# Patient Record
Sex: Female | Born: 1975 | Race: White | Hispanic: No | Smoking: Former smoker
Health system: Southern US, Community
[De-identification: ages and names within clinical notes are randomized; demographics above are authoritative.]

## PROBLEM LIST (undated history)

## (undated) HISTORY — PX: CARPAL TUNNEL RELEASE: SHX101

## (undated) HISTORY — PX: TUBAL LIGATION: SHX77

---

## 2017-01-30 DIAGNOSIS — Z6841 Body Mass Index (BMI) 40.0 and over, adult: Secondary | ICD-10-CM | POA: Diagnosis not present

## 2017-01-30 DIAGNOSIS — F329 Major depressive disorder, single episode, unspecified: Secondary | ICD-10-CM | POA: Diagnosis not present

## 2017-01-30 DIAGNOSIS — E559 Vitamin D deficiency, unspecified: Secondary | ICD-10-CM | POA: Diagnosis not present

## 2017-01-30 DIAGNOSIS — E785 Hyperlipidemia, unspecified: Secondary | ICD-10-CM | POA: Diagnosis not present

## 2017-01-30 DIAGNOSIS — I10 Essential (primary) hypertension: Secondary | ICD-10-CM | POA: Diagnosis not present

## 2017-01-30 DIAGNOSIS — D509 Iron deficiency anemia, unspecified: Secondary | ICD-10-CM | POA: Diagnosis not present

## 2017-02-12 MED FILL — BUPROPION HCL XL 150 MG TAB: 150 | 90 days supply | Qty: 90 | Fill #0

## 2017-02-12 MED FILL — OMEPRAZOLE DR 20 MG CAPSULE: 20 | 90 days supply | Qty: 90 | Fill #0

## 2017-02-12 MED FILL — VALACYCLOVIR HCL 500 MG TAB: 500 | 90 days supply | Qty: 180 | Fill #0

## 2017-02-12 MED FILL — LISINOPRIL 10 MG TABLET: 10 | 90 days supply | Qty: 90 | Fill #0

## 2017-03-06 DIAGNOSIS — D509 Iron deficiency anemia, unspecified: Secondary | ICD-10-CM | POA: Diagnosis not present

## 2017-03-06 DIAGNOSIS — K625 Hemorrhage of anus and rectum: Secondary | ICD-10-CM | POA: Diagnosis not present

## 2017-04-03 MED FILL — GAVILYTE-N SOLUTION: 420 | 10 days supply | Qty: 4000 | Fill #0

## 2017-04-10 DIAGNOSIS — K5730 Diverticulosis of large intestine without perforation or abscess without bleeding: Secondary | ICD-10-CM | POA: Diagnosis not present

## 2017-04-10 DIAGNOSIS — K921 Melena: Secondary | ICD-10-CM | POA: Diagnosis not present

## 2017-04-10 DIAGNOSIS — D120 Benign neoplasm of cecum: Secondary | ICD-10-CM | POA: Diagnosis not present

## 2017-04-10 DIAGNOSIS — K635 Polyp of colon: Secondary | ICD-10-CM | POA: Diagnosis not present

## 2017-04-10 DIAGNOSIS — D509 Iron deficiency anemia, unspecified: Secondary | ICD-10-CM | POA: Diagnosis not present

## 2017-04-10 DIAGNOSIS — K625 Hemorrhage of anus and rectum: Secondary | ICD-10-CM | POA: Diagnosis not present

## 2017-05-22 DIAGNOSIS — Z6841 Body Mass Index (BMI) 40.0 and over, adult: Secondary | ICD-10-CM | POA: Diagnosis not present

## 2017-05-22 DIAGNOSIS — Z0000 Encounter for general adult medical examination without abnormal findings: Secondary | ICD-10-CM | POA: Diagnosis not present

## 2017-05-22 DIAGNOSIS — B009 Herpesviral infection, unspecified: Secondary | ICD-10-CM | POA: Diagnosis not present

## 2017-05-22 DIAGNOSIS — E785 Hyperlipidemia, unspecified: Secondary | ICD-10-CM | POA: Diagnosis not present

## 2017-05-22 DIAGNOSIS — D509 Iron deficiency anemia, unspecified: Secondary | ICD-10-CM | POA: Diagnosis not present

## 2017-05-22 DIAGNOSIS — M2550 Pain in unspecified joint: Secondary | ICD-10-CM | POA: Diagnosis not present

## 2017-05-22 DIAGNOSIS — I10 Essential (primary) hypertension: Secondary | ICD-10-CM | POA: Diagnosis not present

## 2017-05-22 DIAGNOSIS — F329 Major depressive disorder, single episode, unspecified: Secondary | ICD-10-CM | POA: Diagnosis not present

## 2017-05-28 MED FILL — OMEPRAZOLE 20 MG CAP: 20 | 90 days supply | Qty: 90 | Fill #1

## 2017-05-28 MED FILL — DICLOFENAC SOD 75 MG TAB EC: 75 | 90 days supply | Qty: 180 | Fill #0

## 2017-07-15 MED FILL — LISINOPRIL 10 MG TABS: 10 | 90 days supply | Qty: 90 | Fill #1

## 2017-07-22 DIAGNOSIS — F329 Major depressive disorder, single episode, unspecified: Secondary | ICD-10-CM | POA: Diagnosis not present

## 2017-08-13 DIAGNOSIS — Z1231 Encounter for screening mammogram for malignant neoplasm of breast: Secondary | ICD-10-CM | POA: Diagnosis not present

## 2017-08-13 MED FILL — SERTRALINE HCL 50 MG TABLET: 50 | 30 days supply | Qty: 30 | Fill #0

## 2017-08-14 MED FILL — VALACYCLOVIR HCL 500 MG TAB: 500 | 90 days supply | Qty: 180 | Fill #1

## 2017-09-10 MED FILL — OMEPRAZOLE 20 MG CAP: 20 | 90 days supply | Qty: 90 | Fill #0

## 2017-09-11 MED FILL — SERTRALINE HCL 50 MG TABLET: 50 | 30 days supply | Qty: 30 | Fill #1

## 2017-09-29 MED FILL — DICLOFENAC SODIUM 75 MG TAB: 75 | 90 days supply | Qty: 180 | Fill #1

## 2017-09-30 DIAGNOSIS — H52223 Regular astigmatism, bilateral: Secondary | ICD-10-CM | POA: Diagnosis not present

## 2017-09-30 DIAGNOSIS — J069 Acute upper respiratory infection, unspecified: Secondary | ICD-10-CM | POA: Diagnosis not present

## 2017-09-30 DIAGNOSIS — H40013 Open angle with borderline findings, low risk, bilateral: Secondary | ICD-10-CM | POA: Diagnosis not present

## 2017-09-30 DIAGNOSIS — F329 Major depressive disorder, single episode, unspecified: Secondary | ICD-10-CM | POA: Diagnosis not present

## 2017-09-30 DIAGNOSIS — H5203 Hypermetropia, bilateral: Secondary | ICD-10-CM | POA: Diagnosis not present

## 2017-10-08 MED FILL — LISINOPRIL 10 MG TABS: 10 | 90 days supply | Qty: 90 | Fill #0

## 2017-10-09 MED FILL — SERTRALINE HCL 50 MG TABLET: 50 | 90 days supply | Qty: 90 | Fill #0

## 2017-12-08 MED FILL — OMEPRAZOLE 20 MG CAP: 20 | 90 days supply | Qty: 90 | Fill #0

## 2017-12-08 MED FILL — VALACYCLOVIR HCL 500 MG TAB: 500 | 90 days supply | Qty: 180 | Fill #0

## 2017-12-22 DIAGNOSIS — M2550 Pain in unspecified joint: Secondary | ICD-10-CM | POA: Diagnosis not present

## 2017-12-22 DIAGNOSIS — F329 Major depressive disorder, single episode, unspecified: Secondary | ICD-10-CM | POA: Diagnosis not present

## 2017-12-22 DIAGNOSIS — E559 Vitamin D deficiency, unspecified: Secondary | ICD-10-CM | POA: Diagnosis not present

## 2017-12-22 DIAGNOSIS — E785 Hyperlipidemia, unspecified: Secondary | ICD-10-CM | POA: Diagnosis not present

## 2017-12-22 DIAGNOSIS — B009 Herpesviral infection, unspecified: Secondary | ICD-10-CM | POA: Diagnosis not present

## 2017-12-22 DIAGNOSIS — Z6841 Body Mass Index (BMI) 40.0 and over, adult: Secondary | ICD-10-CM | POA: Diagnosis not present

## 2017-12-22 DIAGNOSIS — D509 Iron deficiency anemia, unspecified: Secondary | ICD-10-CM | POA: Diagnosis not present

## 2017-12-22 DIAGNOSIS — I10 Essential (primary) hypertension: Secondary | ICD-10-CM | POA: Diagnosis not present

## 2018-01-07 MED FILL — SERTRALINE HCL 50 MG TABLET: 50 | 90 days supply | Qty: 90 | Fill #0

## 2018-01-07 MED FILL — LISINOPRIL 10 MG TABLET: 10 | 90 days supply | Qty: 90 | Fill #1

## 2018-01-21 DIAGNOSIS — D500 Iron deficiency anemia secondary to blood loss (chronic): Secondary | ICD-10-CM | POA: Diagnosis not present

## 2018-01-21 DIAGNOSIS — Z01411 Encounter for gynecological examination (general) (routine) with abnormal findings: Secondary | ICD-10-CM | POA: Diagnosis not present

## 2018-01-21 DIAGNOSIS — N939 Abnormal uterine and vaginal bleeding, unspecified: Secondary | ICD-10-CM | POA: Diagnosis not present

## 2018-01-21 DIAGNOSIS — Z6841 Body Mass Index (BMI) 40.0 and over, adult: Secondary | ICD-10-CM | POA: Diagnosis not present

## 2018-01-28 MED FILL — DICLOFENAC SODIUM 75 MG TAB: 75 | 90 days supply | Qty: 180 | Fill #2

## 2018-02-04 DIAGNOSIS — G5601 Carpal tunnel syndrome, right upper limb: Secondary | ICD-10-CM | POA: Diagnosis not present

## 2018-02-04 DIAGNOSIS — M79641 Pain in right hand: Secondary | ICD-10-CM | POA: Diagnosis not present

## 2018-02-04 DIAGNOSIS — M79642 Pain in left hand: Secondary | ICD-10-CM | POA: Diagnosis not present

## 2018-02-17 DIAGNOSIS — N939 Abnormal uterine and vaginal bleeding, unspecified: Secondary | ICD-10-CM | POA: Diagnosis not present

## 2018-03-03 DIAGNOSIS — Z87891 Personal history of nicotine dependence: Secondary | ICD-10-CM | POA: Diagnosis not present

## 2018-03-03 DIAGNOSIS — G5601 Carpal tunnel syndrome, right upper limb: Secondary | ICD-10-CM | POA: Diagnosis not present

## 2018-03-03 DIAGNOSIS — I10 Essential (primary) hypertension: Secondary | ICD-10-CM | POA: Diagnosis not present

## 2018-03-03 DIAGNOSIS — Z6841 Body Mass Index (BMI) 40.0 and over, adult: Secondary | ICD-10-CM | POA: Diagnosis not present

## 2018-03-03 DIAGNOSIS — E669 Obesity, unspecified: Secondary | ICD-10-CM | POA: Diagnosis not present

## 2018-03-03 DIAGNOSIS — K219 Gastro-esophageal reflux disease without esophagitis: Secondary | ICD-10-CM | POA: Diagnosis not present

## 2018-03-03 DIAGNOSIS — F329 Major depressive disorder, single episode, unspecified: Secondary | ICD-10-CM | POA: Diagnosis not present

## 2018-03-09 MED FILL — OMEPRAZOLE 20 MG CAP: 20 | 90 days supply | Qty: 90 | Fill #1

## 2018-03-23 DIAGNOSIS — N939 Abnormal uterine and vaginal bleeding, unspecified: Secondary | ICD-10-CM | POA: Diagnosis not present

## 2018-03-23 DIAGNOSIS — N8501 Benign endometrial hyperplasia: Secondary | ICD-10-CM | POA: Diagnosis not present

## 2018-04-02 DIAGNOSIS — H40013 Open angle with borderline findings, low risk, bilateral: Secondary | ICD-10-CM | POA: Diagnosis not present

## 2018-04-02 DIAGNOSIS — H50811 Duane's syndrome, right eye: Secondary | ICD-10-CM | POA: Diagnosis not present

## 2018-04-09 MED FILL — SERTRALINE HCL 50 MG TABLET: 50 | 90 days supply | Qty: 90 | Fill #1

## 2018-04-09 MED FILL — VALACYCLOVIR HCL 500 MG TAB: 500 | 90 days supply | Qty: 180 | Fill #1

## 2018-04-09 MED FILL — LISINOPRIL 10 MG TABLET: 10 | 90 days supply | Qty: 90 | Fill #0

## 2018-04-22 MED FILL — SETLAKIN 0.15 MG-0.03 MG TA: 0.15-0.03 | 91 days supply | Qty: 91 | Fill #0

## 2018-05-27 DIAGNOSIS — N939 Abnormal uterine and vaginal bleeding, unspecified: Secondary | ICD-10-CM | POA: Diagnosis not present

## 2018-05-27 DIAGNOSIS — Z6841 Body Mass Index (BMI) 40.0 and over, adult: Secondary | ICD-10-CM | POA: Diagnosis not present

## 2018-06-05 MED FILL — OMEPRAZOLE 20 MG CPDR: 20 | 90 days supply | Qty: 90 | Fill #0

## 2018-06-10 ENCOUNTER — Ambulatory Visit (HOSPITAL_COMMUNITY): Admit: 2018-06-10 | Payer: Self-pay | Admitting: Obstetrics & Gynecology

## 2018-06-10 ENCOUNTER — Encounter (HOSPITAL_COMMUNITY): Payer: Self-pay

## 2018-06-10 SURGERY — DILATATION & CURETTAGE/HYSTEROSCOPY WITH HYDROTHERMAL ABLATION
Anesthesia: Choice

## 2018-06-22 DIAGNOSIS — E785 Hyperlipidemia, unspecified: Secondary | ICD-10-CM | POA: Diagnosis not present

## 2018-06-22 DIAGNOSIS — E669 Obesity, unspecified: Secondary | ICD-10-CM | POA: Diagnosis not present

## 2018-06-22 DIAGNOSIS — Z0000 Encounter for general adult medical examination without abnormal findings: Secondary | ICD-10-CM | POA: Diagnosis not present

## 2018-06-22 DIAGNOSIS — D509 Iron deficiency anemia, unspecified: Secondary | ICD-10-CM | POA: Diagnosis not present

## 2018-06-22 DIAGNOSIS — I10 Essential (primary) hypertension: Secondary | ICD-10-CM | POA: Diagnosis not present

## 2018-06-22 MED FILL — traZODone HCL 50 MG TABS: 50 | 30 days supply | Qty: 60 | Fill #0

## 2018-06-23 MED FILL — DICLOFENAC SODIUM 75 MG TAB: 75 | 90 days supply | Qty: 180 | Fill #0

## 2018-07-10 MED FILL — SERTRALINE HCL 50 MG TABLET: 50 | 90 days supply | Qty: 90 | Fill #2

## 2018-07-10 MED FILL — LISINOPRIL 10 MG TABLET: 10 | 90 days supply | Qty: 90 | Fill #1

## 2018-07-12 DIAGNOSIS — J019 Acute sinusitis, unspecified: Secondary | ICD-10-CM | POA: Diagnosis not present

## 2018-07-17 MED FILL — SETLAKIN 0.15 MG-0.03 MG TA: 0.15-0.03 | 91 days supply | Qty: 91 | Fill #1

## 2018-09-04 MED FILL — OMEPRAZOLE 20 MG CPDR: 20 | 90 days supply | Qty: 90 | Fill #0

## 2018-10-05 MED FILL — DICLOFENAC SODIUM 75 MG TAB: 75 | 90 days supply | Qty: 180 | Fill #1 | Status: TO

## 2018-10-05 MED FILL — VALACYCLOVIR HCL 500 MG TAB: 500 | 90 days supply | Qty: 180 | Fill #0

## 2018-10-07 DIAGNOSIS — R05 Cough: Secondary | ICD-10-CM | POA: Diagnosis not present

## 2018-10-07 DIAGNOSIS — J40 Bronchitis, not specified as acute or chronic: Secondary | ICD-10-CM | POA: Diagnosis not present

## 2018-10-07 DIAGNOSIS — R509 Fever, unspecified: Secondary | ICD-10-CM | POA: Diagnosis not present

## 2018-10-07 DIAGNOSIS — R231 Pallor: Secondary | ICD-10-CM | POA: Diagnosis not present

## 2018-10-09 MED FILL — SETLAKIN 0.15 MG-0.03 MG TA: 0.15-0.03 | 91 days supply | Qty: 91 | Fill #2 | Status: TO

## 2018-10-09 MED FILL — SERTRALINE HCL 50 MG TABLET: 50 | 90 days supply | Qty: 90 | Fill #0 | Status: TO

## 2018-10-09 MED FILL — LISINOPRIL 10 MG TABS: 10 | 90 days supply | Qty: 90 | Fill #2

## 2018-10-13 DIAGNOSIS — H40013 Open angle with borderline findings, low risk, bilateral: Secondary | ICD-10-CM | POA: Diagnosis not present

## 2018-10-13 DIAGNOSIS — H50811 Duane's syndrome, right eye: Secondary | ICD-10-CM | POA: Diagnosis not present

## 2018-10-13 DIAGNOSIS — H52223 Regular astigmatism, bilateral: Secondary | ICD-10-CM | POA: Diagnosis not present

## 2018-10-13 DIAGNOSIS — H5203 Hypermetropia, bilateral: Secondary | ICD-10-CM | POA: Diagnosis not present

## 2018-10-27 MED FILL — HYDROCORTISONE ACETATE 25 M: 25 | 10 days supply | Qty: 30 | Fill #0

## 2018-11-02 ENCOUNTER — Institutional Professional Consult (permissible substitution): Payer: Self-pay | Admitting: Pulmonary Disease

## 2018-11-04 ENCOUNTER — Encounter: Payer: Self-pay | Admitting: Pulmonary Disease

## 2018-11-04 ENCOUNTER — Other Ambulatory Visit: Payer: Self-pay

## 2018-11-04 ENCOUNTER — Ambulatory Visit: Payer: 59 | Admitting: Pulmonary Disease

## 2018-11-04 ENCOUNTER — Ambulatory Visit (INDEPENDENT_AMBULATORY_CARE_PROVIDER_SITE_OTHER)
Admission: RE | Admit: 2018-11-04 | Discharge: 2018-11-04 | Disposition: A | Discharge status: Home / Self Care | Payer: 59 | Source: Ambulatory Visit | Attending: Pulmonary Disease | Admitting: Pulmonary Disease

## 2018-11-04 VITALS — BP 128/82 | HR 72 | Ht 63.0 in | Wt 254.0 lb

## 2018-11-04 DIAGNOSIS — J849 Interstitial pulmonary disease, unspecified: Secondary | ICD-10-CM

## 2018-11-04 DIAGNOSIS — R9389 Abnormal findings on diagnostic imaging of other specified body structures: Secondary | ICD-10-CM | POA: Diagnosis not present

## 2018-11-04 NOTE — Progress Notes (Signed)
Subjective:    Patient ID: Heather Martin, female    DOB: 09/11/1975, 43 y.o.   MRN: [id number]  HPI  Chief Complaint  Patient presents with  . Pulm Consult    Referred by [facility] for increased SOB with activities. Noticed this after being sick back in November 2762.     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 She reports lower respiratory infection in 06/2018 when she was given antibiotics and since then she has been experiencing dyspnea on unusual exertion such as walking on level ground for a few minutes.  She recovered from this episode and had another episode of bronchitis and 09/2018 where she went to her PCP, flu test was negative he was given Depo-Medrol. Chest x-ray was performed 10/07/2018 which showed" diffuse reticulonodular opacities probably due to chronic lung disease"  She reports persistent dyspnea although her acute symptoms have resolved.  Occasional dry cough.  Heartburn is controlled on Prilosec.  Blood pressures controlled on lisinopril. She denies wheezing .  He reports occasional mild pedal edema.  She reports taking diclofenac for several years due to pain in her knees and ankles and back.  She has been tested for autoimmune arthritis in the past which was negative.  She lives in an older home built in the 78s, there is a history of leakage in her roof and mold.  She uses gas heat.  She lives with 3 dogs sleep in her bed.  She smoked about a pack per day until she quit in 2009, less than 20 pack years.  She was exposed to secondhand smoke from her mom who has severe COPD  Past medical history-depression heartburn, hypertension, carpal tunnel syndrome  Past surgical history tubal ligation, carpal tunnel release  Chest x-ray was obtained today which I personally reviewed which shows prominent interstitium, lateral film does not suggest any infiltrate, agree that interstitial markings do extend to lateral  third of the lung fields  Allergies  Allergen Reactions  . Cashew Nut Oil Anaphylaxis  . Etodolac Hives  . Silver Nitrate Other (See Comments)    Caused skin to burn black during procedure.     Social History   Socioeconomic History  . Marital status: Significant Other    Spouse name: Not on file  . Number of children: Not on file  . Years of education: Not on file  . Highest education level: Not on file  Occupational History  . Not on file  Social Needs  . Financial resource strain: Not on file  . Food insecurity:    Worry: Not on file    Inability: Not on file  . Transportation needs:    Medical: Not on file    Non-medical: Not on file  Tobacco Use  . Smoking status: Former Smoker    Years: 10.00    Last attempt to quit: 11/03/2008    Years since quitting: 10.0  . Smokeless tobacco: Never Used  Substance and Sexual Activity  . Alcohol use: Not on file  . Drug use: Not on file  . Sexual activity: Not on file  Lifestyle  . Physical activity:    Days per week: Not on file    Minutes per session: Not on file  . Stress: Not on file  Relationships  . Social connections:    Talks on phone: Not on file    Gets together: Not on file    Attends religious service: Not on  file    Active member of club or organization: Not on file    Attends meetings of clubs or organizations: Not on file    Relationship status: Not on file  . Intimate partner violence:    Fear of current or ex partner: Not on file    Emotionally abused: Not on file    Physically abused: Not on file    Forced sexual activity: Not on file  Other Topics Concern  . Not on file  Social History Narrative  . Not on file     Family History  Problem Relation Age of Onset  . Hypertension Mother   . High Cholesterol Mother   . Mental illness Mother   . Hypertension Father   . Diabetes Father   . Depression Father   . High Cholesterol Father   . Heart attack Father 34  . Hypertension Sister   .  Diabetes Sister       Review of Systems  Constitutional: Negative for fever and unexpected weight change.  HENT: Negative for congestion, dental problem, ear pain, nosebleeds, postnasal drip, rhinorrhea, sinus pressure, sneezing, sore throat and trouble swallowing.   Eyes: Negative for redness and itching.  Respiratory: Positive for shortness of breath. Negative for cough, chest tightness and wheezing.   Cardiovascular: Negative for palpitations and leg swelling.  Gastrointestinal: Negative for nausea and vomiting.  Genitourinary: Negative for dysuria.  Musculoskeletal: Negative for joint swelling.  Skin: Negative for rash.  Allergic/Immunologic: Negative.  Negative for environmental allergies, food allergies and immunocompromised state.  Neurological: Negative for headaches.  Hematological: Does not bruise/bleed easily.  Psychiatric/Behavioral: Negative for dysphoric mood. The patient is not nervous/anxious.        Objective:   Physical Exam  Gen. Pleasant, obese, in no distress, normal affect ENT - no pallor,icterus, no post nasal drip, class 2-3 airway Neck: No JVD, no thyromegaly, no carotid bruits Lungs: no use of accessory muscles, no dullness to percussion, decreased without rales or rhonchi  Cardiovascular: Rhythm regular, heart sounds  normal, no murmurs or gallops, no peripheral edema Abdomen: soft and non-tender, no hepatosplenomegaly, BS normal. Musculoskeletal: No deformities, no cyanosis or clubbing Neuro:  alert, non focal, no tremors       Assessment & Plan:

## 2018-11-04 NOTE — Patient Instructions (Signed)
Await official read on CXR before deciding on CT scan

## 2018-11-05 DIAGNOSIS — R9389 Abnormal findings on diagnostic imaging of other specified body structures: Secondary | ICD-10-CM | POA: Insufficient documentation

## 2018-11-05 NOTE — Assessment & Plan Note (Signed)
Given interstitial prominence on chest x-ray, will obtain high-resolution CT chest without contrast to rule out ILD. I do not have the older film to review. Clinically her lungs sound clear and she does not have any stigmata of collagen vascular disease We will defer PFTs until CT is obtained to clarify

## 2018-11-11 ENCOUNTER — Telehealth: Payer: Self-pay | Admitting: Pulmonary Disease

## 2018-11-11 DIAGNOSIS — R9389 Abnormal findings on diagnostic imaging of other specified body structures: Secondary | ICD-10-CM

## 2018-11-11 DIAGNOSIS — J849 Interstitial pulmonary disease, unspecified: Secondary | ICD-10-CM

## 2018-11-11 NOTE — Telephone Encounter (Signed)
Notes recorded by Valerie Salts, CMA on 11/06/2018 at 4:49 PM EDT Left message for patient to call back. ------  Notes recorded by Rigoberto Noel, MD on 11/05/2018 at 2:40 PM EDT Radiologist read as minimal interstitial prominence. I am not concerned but would obtain high-resolution CT chest without contrast to clarify  Called and spoke with pt letting her know the results of the cxr and stated to her that RA wanted her to get a CT to further clarify what was seen by radiologist. Pt expressed understanding. Order has been placed for HRCT. Nothing further needed.

## 2018-11-19 DIAGNOSIS — J309 Allergic rhinitis, unspecified: Secondary | ICD-10-CM | POA: Diagnosis not present

## 2018-11-19 DIAGNOSIS — R0609 Other forms of dyspnea: Secondary | ICD-10-CM | POA: Diagnosis not present

## 2018-11-27 MED FILL — LISINOPRIL 10 MG TABLET: 10 | 90 days supply | Qty: 90 | Fill #0

## 2018-11-27 MED FILL — OMEPRAZOLE 20 MG CPDR: 20 | 90 days supply | Qty: 90 | Fill #0

## 2018-11-30 MED FILL — traZODone HCL 50 MG TABS: 50 | 30 days supply | Qty: 60 | Fill #0

## 2018-12-11 MED FILL — DICLOFENAC SODIUM 75 MG TAB: 75 | 90 days supply | Qty: 180 | Fill #0

## 2018-12-11 MED FILL — SETLAKIN 0.15 MG-0.03 MG TA: 0.15-0.03 | 91 days supply | Qty: 91 | Fill #0 | Status: TO

## 2018-12-11 MED FILL — SERTRALINE HCL 50 MG TABLET: 50 | 90 days supply | Qty: 90 | Fill #0

## 2018-12-14 ENCOUNTER — Ambulatory Visit (HOSPITAL_COMMUNITY)
Admission: RE | Admit: 2018-12-14 | Discharge: 2018-12-14 | Disposition: A | Discharge status: Home / Self Care | Payer: 59 | Source: Ambulatory Visit | Attending: Pulmonary Disease | Admitting: Pulmonary Disease

## 2018-12-14 ENCOUNTER — Other Ambulatory Visit: Payer: Self-pay

## 2018-12-14 ENCOUNTER — Ambulatory Visit (HOSPITAL_COMMUNITY): Payer: 59

## 2018-12-14 DIAGNOSIS — R9389 Abnormal findings on diagnostic imaging of other specified body structures: Secondary | ICD-10-CM | POA: Insufficient documentation

## 2018-12-14 DIAGNOSIS — J849 Interstitial pulmonary disease, unspecified: Secondary | ICD-10-CM | POA: Insufficient documentation

## 2018-12-14 DIAGNOSIS — J984 Other disorders of lung: Secondary | ICD-10-CM | POA: Diagnosis not present

## 2018-12-14 MED FILL — MONTELUKAST SOD 10 MG TAB: 10 | 30 days supply | Qty: 30 | Fill #0

## 2018-12-15 ENCOUNTER — Telehealth: Payer: Self-pay | Admitting: Pulmonary Disease

## 2018-12-15 NOTE — Telephone Encounter (Signed)
Returned call to patient. Gave results of CT scan Pt requested results be sent to another provider. Informed that I could release to her via mychart and she can send to her provider. She wanted to know if her provider would be able to see images. Informed only if they have access to PACS system. Nothing further needed.

## 2018-12-25 DIAGNOSIS — E669 Obesity, unspecified: Secondary | ICD-10-CM | POA: Diagnosis not present

## 2018-12-25 DIAGNOSIS — I10 Essential (primary) hypertension: Secondary | ICD-10-CM | POA: Diagnosis not present

## 2018-12-25 DIAGNOSIS — D509 Iron deficiency anemia, unspecified: Secondary | ICD-10-CM | POA: Diagnosis not present

## 2018-12-25 DIAGNOSIS — E785 Hyperlipidemia, unspecified: Secondary | ICD-10-CM | POA: Diagnosis not present

## 2019-01-05 DIAGNOSIS — M2550 Pain in unspecified joint: Secondary | ICD-10-CM | POA: Diagnosis not present

## 2019-01-05 DIAGNOSIS — E785 Hyperlipidemia, unspecified: Secondary | ICD-10-CM | POA: Diagnosis not present

## 2019-01-05 DIAGNOSIS — I10 Essential (primary) hypertension: Secondary | ICD-10-CM | POA: Diagnosis not present

## 2019-01-05 DIAGNOSIS — D509 Iron deficiency anemia, unspecified: Secondary | ICD-10-CM | POA: Diagnosis not present

## 2019-01-05 DIAGNOSIS — E669 Obesity, unspecified: Secondary | ICD-10-CM | POA: Diagnosis not present

## 2019-01-06 MED FILL — DICYCLOMINE 20 MG TABLET: 20 | 30 days supply | Qty: 90 | Fill #0

## 2019-01-06 MED FILL — FLUTICASONE PROP 50 MCG SPR: 50 | 60 days supply | Qty: 16 | Fill #0 | Status: TO

## 2019-01-06 MED FILL — traZODone HCL 50 MG TABS: 50 | 30 days supply | Qty: 60 | Fill #0 | Status: TO

## 2019-01-07 MED FILL — MONTELUKAST SOD 10 MG TAB: 10 | 90 days supply | Qty: 90 | Fill #0 | Status: TO

## 2019-02-02 MED FILL — SERTRALINE HCL 100 MG TAB: 100 | 90 days supply | Qty: 90 | Fill #0

## 2019-03-01 MED FILL — OMEPRAZOLE 20 MG CPDR: 20 | 90 days supply | Qty: 90 | Fill #0

## 2019-03-10 MED FILL — VALACYCLOVIR HCL 500 MG TAB: 500 | 90 days supply | Qty: 90 | Fill #0

## 2019-04-02 DIAGNOSIS — L03213 Periorbital cellulitis: Secondary | ICD-10-CM | POA: Diagnosis not present

## 2019-04-02 DIAGNOSIS — H0011 Chalazion right upper eyelid: Secondary | ICD-10-CM | POA: Diagnosis not present

## 2019-04-19 MED FILL — LISINOPRIL 10 MG TABS: 10 | 90 days supply | Qty: 90 | Fill #0

## 2019-04-19 MED FILL — DICLOFENAC SODIUM 75 MG TAB: 75 | 90 days supply | Qty: 180 | Fill #0

## 2019-04-19 MED FILL — SETLAKIN 0.15 MG-0.03 MG TA: 0.15-0.03 | 91 days supply | Qty: 91 | Fill #0

## 2019-04-19 MED FILL — MONTELUKAST SOD 10 MG TAB: 10 | 90 days supply | Qty: 90 | Fill #0

## 2019-04-19 MED FILL — SERTRALINE HCL 100 MG TAB: 100 | 90 days supply | Qty: 90 | Fill #1

## 2019-06-10 MED FILL — OMEPRAZOLE 20 MG CAPSULE DR: 20 | 90 days supply | Qty: 90 | Fill #1

## 2019-06-10 MED FILL — VALACYCLOVIR HCL 500 MG TAB: 500 | 90 days supply | Qty: 90 | Fill #1

## 2019-06-21 DIAGNOSIS — I10 Essential (primary) hypertension: Secondary | ICD-10-CM | POA: Diagnosis not present

## 2019-06-21 DIAGNOSIS — E785 Hyperlipidemia, unspecified: Secondary | ICD-10-CM | POA: Diagnosis not present

## 2019-06-21 DIAGNOSIS — Z0000 Encounter for general adult medical examination without abnormal findings: Secondary | ICD-10-CM | POA: Diagnosis not present

## 2019-06-21 DIAGNOSIS — F329 Major depressive disorder, single episode, unspecified: Secondary | ICD-10-CM | POA: Diagnosis not present

## 2019-06-21 DIAGNOSIS — D509 Iron deficiency anemia, unspecified: Secondary | ICD-10-CM | POA: Diagnosis not present

## 2019-06-21 MED FILL — SERTRALINE HCL 50 MG TABLET: 50 | 90 days supply | Qty: 90 | Fill #0

## 2019-06-21 MED FILL — FERROUS SULFATE 325 MG TAB: 325 (65 FE) | 100 days supply | Qty: 100 | Fill #0

## 2019-07-21 MED FILL — SERTRALINE HCL 100 MG TAB: 100 | 90 days supply | Qty: 90 | Fill #2

## 2019-07-21 MED FILL — LISINOPRIL 10 MG TABS: 10 | 90 days supply | Qty: 90 | Fill #1

## 2019-07-21 MED FILL — SETLAKIN 0.15 MG-0.03 MG TA: 0.15-0.03 | 91 days supply | Qty: 91 | Fill #0

## 2019-08-30 DIAGNOSIS — B349 Viral infection, unspecified: Secondary | ICD-10-CM | POA: Diagnosis not present

## 2019-08-30 DIAGNOSIS — Z20828 Contact with and (suspected) exposure to other viral communicable diseases: Secondary | ICD-10-CM | POA: Diagnosis not present

## 2019-08-30 DIAGNOSIS — Z20822 Contact with and (suspected) exposure to covid-19: Secondary | ICD-10-CM | POA: Diagnosis not present

## 2019-09-09 MED FILL — OMEPRAZOLE 20 MG CAPSULE DR: 20 | 90 days supply | Qty: 90 | Fill #2

## 2019-09-09 MED FILL — DICLOFENAC SODIUM 75 MG TAB: 75 | 90 days supply | Qty: 180 | Fill #1

## 2019-09-09 MED FILL — VALACYCLOVIR HCL 500 MG TAB: 500 | 90 days supply | Qty: 90 | Fill #2

## 2019-10-15 DIAGNOSIS — Z1231 Encounter for screening mammogram for malignant neoplasm of breast: Secondary | ICD-10-CM | POA: Diagnosis not present

## 2019-10-20 MED FILL — LISINOPRIL 10 MG TABS: 10 | 90 days supply | Qty: 90 | Fill #2

## 2019-10-20 MED FILL — SERTRALINE HCL 100 MG TAB: 100 | 90 days supply | Qty: 90 | Fill #0

## 2019-10-20 MED FILL — SETLAKIN 0.15 MG-0.03 MG TA: 0.15-0.03 | 91 days supply | Qty: 91 | Fill #0

## 2019-10-29 IMAGING — CT CT CHEST HIGH RESOLUTION WITHOUT CONTRAST
2 of 6 series · 13 of 36 positions shown, 16 images · non-contrast
Comparison: None.

CLINICAL DATA: 42-year-old female with history of shortness of
breath. Abnormal chest x-ray. Evaluate for potential interstitial
lung disease.

EXAM:
CT CHEST WITHOUT CONTRAST
TECHNIQUE: Multidetector CT imaging of the chest was performed following the
standard protocol without intravenous contrast. High resolution
imaging of the lungs, as well as inspiratory and expiratory imaging,
was performed.

[Series 7: chest w/o 2mm st cor · coronal · non-contrast · 0.61mm/px · 3 of 151 slices shown]
[im 31/151  lung]
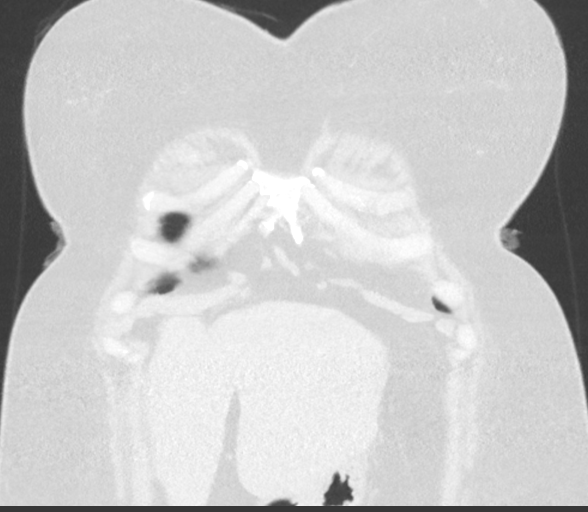
[im 61/151  lung]
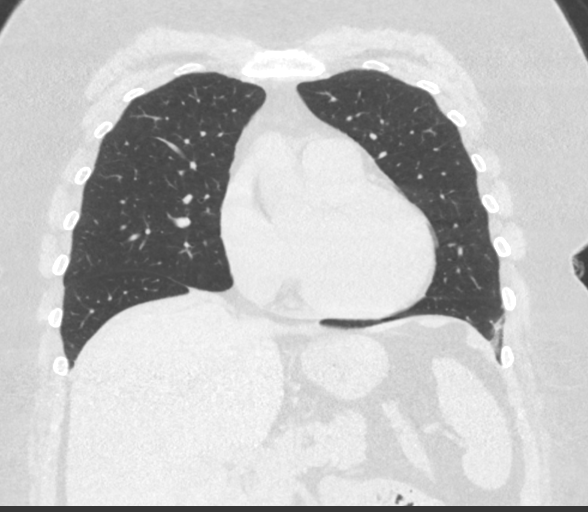
[im 91/151  lung]
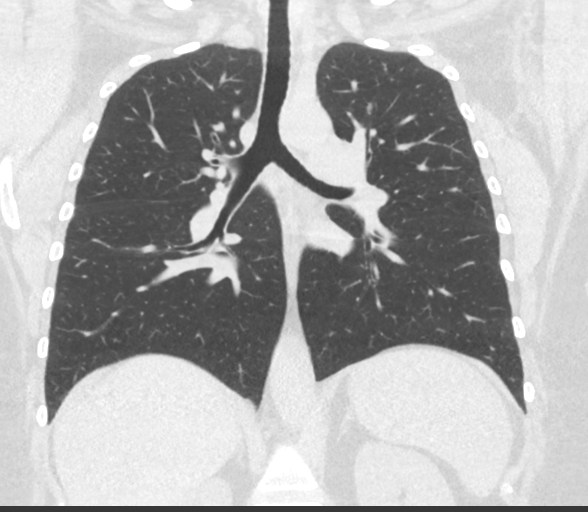

[Series 9: chest w/o sft thins · axial · non-contrast · 0.59mm/px · z∈[+1269,+1542]mm · 10 of 470 slices shown, 13 images]
[im 25/470  mediastinal]
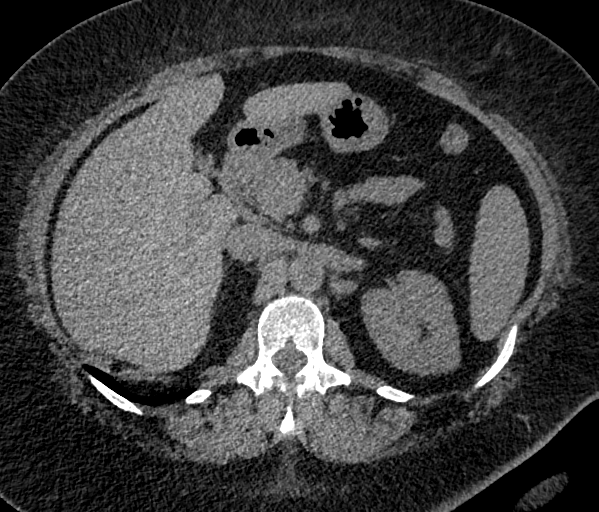
[im 25/470  lung]
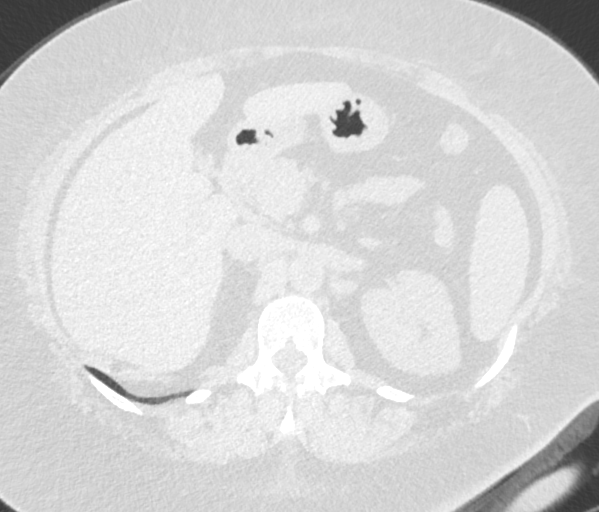
[im 75/470  lung]
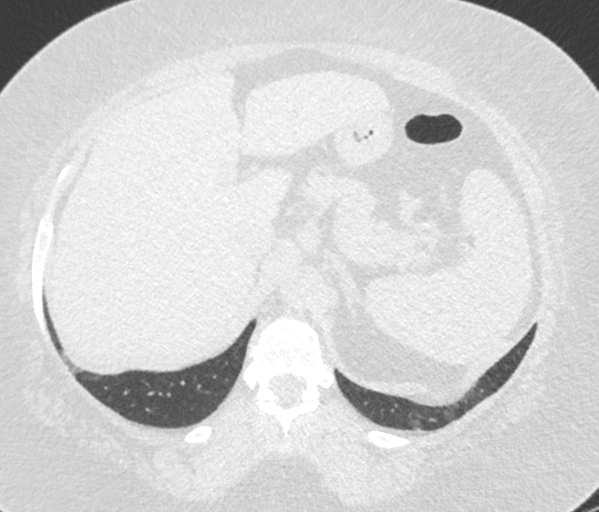
[im 124/470  lung]
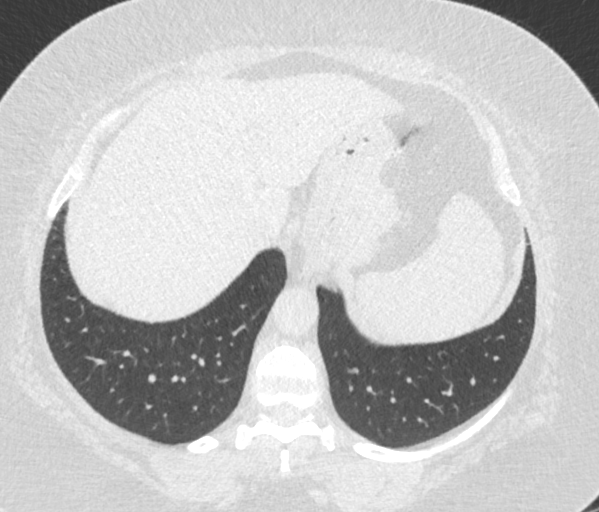
[im 173/470  lung]
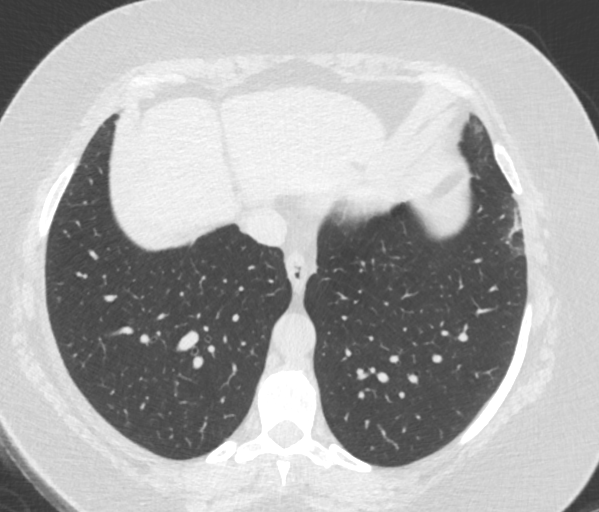
[im 223/470  mediastinal]
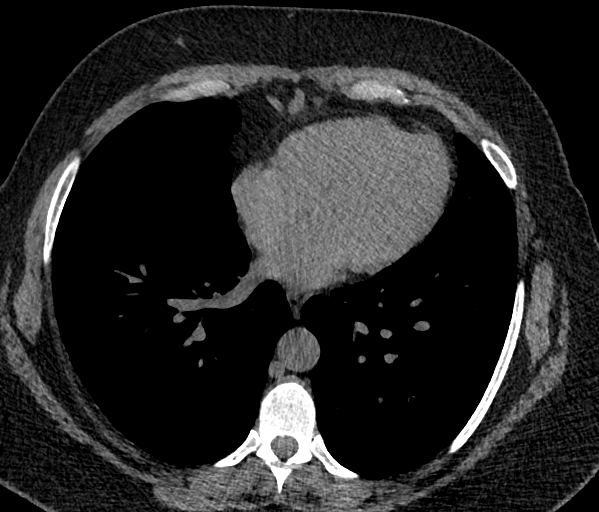
[im 223/470  lung]
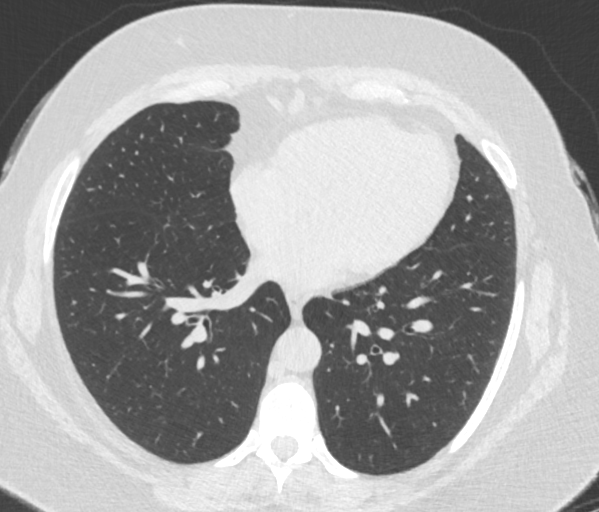
[im 247/470  lung]
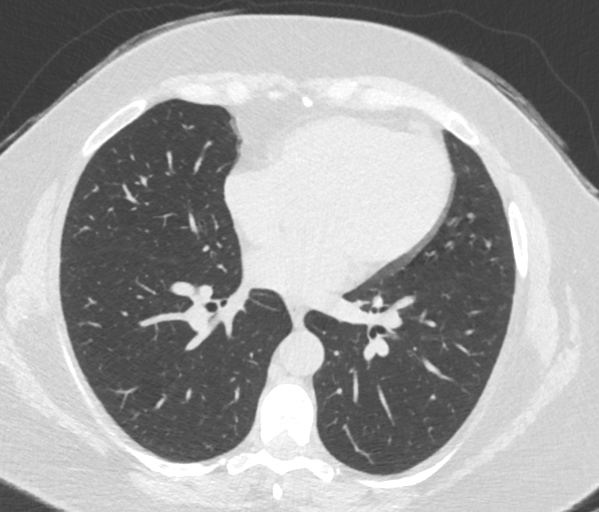
[im 297/470  lung]
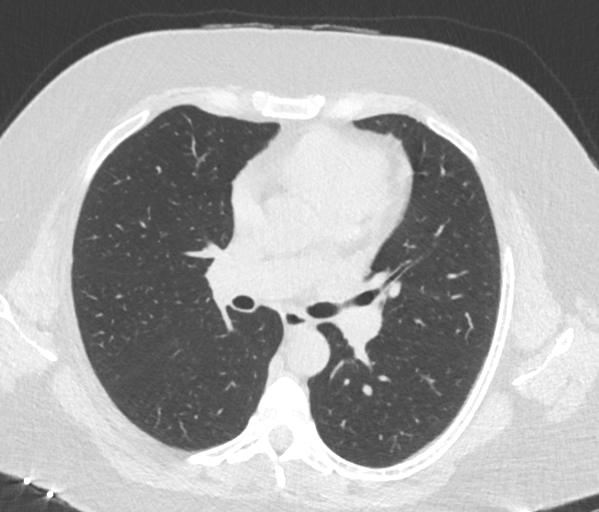
[im 346/470  lung]
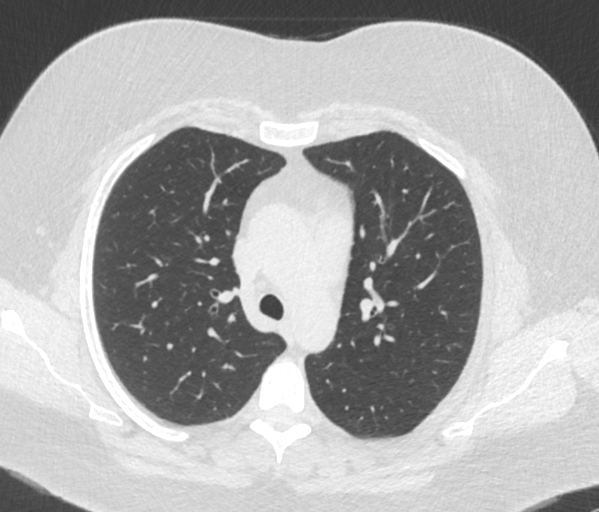
[im 395/470  mediastinal]
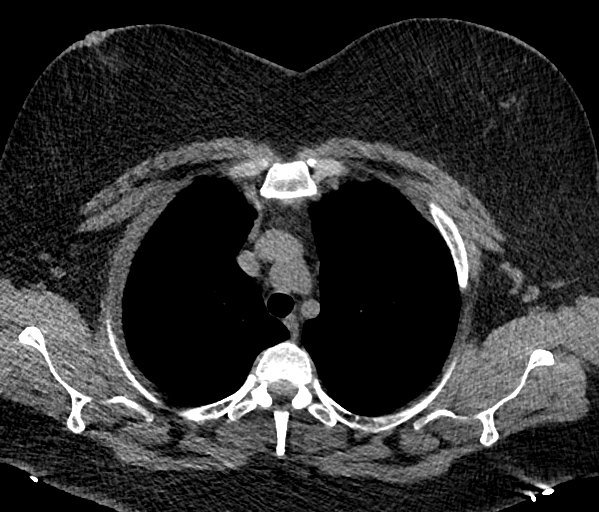
[im 395/470  lung]
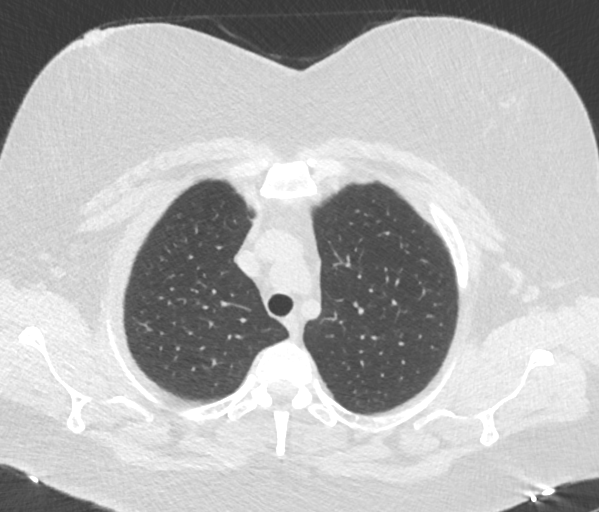
[im 445/470  lung]
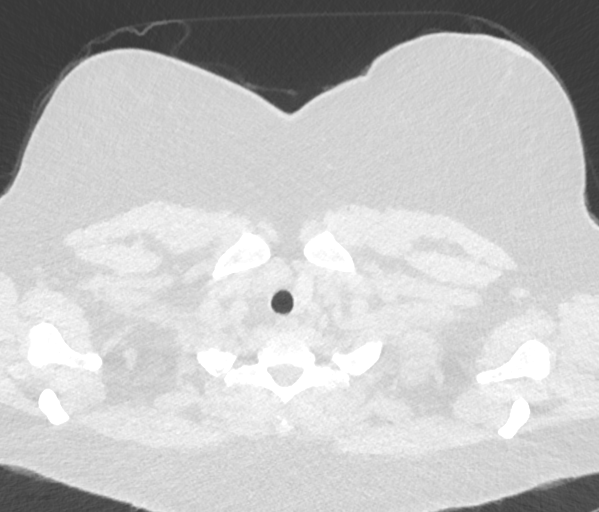

[13 of 36 positions shown; findings below may reference images not displayed]

FINDINGS: Cardiovascular: Heart size is normal. There is no significant
pericardial fluid, thickening or pericardial calcification. No
atherosclerotic calcifications noted in the thoracic aorta or the
coronary arteries.

Mediastinum/Nodes: No pathologically enlarged mediastinal or hilar
lymph nodes. Please note that accurate exclusion of hilar adenopathy
is limited on noncontrast CT scans. Esophagus is unremarkable in
appearance. No axillary lymphadenopathy.

Lungs/Pleura: Minimal ground-glass attenuation and peripheral septal
thickening in the inferior segment of the lingula and anterior
aspect of the left lower lobe laterally. This is isolated to these
regions, and high-resolution images otherwise demonstrate no more
generalized regions of ground-glass attenuation, septal thickening,
subpleural reticulation, parenchymal banding, traction
bronchiectasis or frank honeycombing. Inspiratory and expiratory
imaging is unremarkable. No suspicious appearing pulmonary nodules
or masses.

Upper Abdomen: Unremarkable.

Musculoskeletal: There are no aggressive appearing lytic or blastic
lesions noted in the visualized portions of the skeleton.
IMPRESSION: 1. While there is some very mild scarring in the inferior aspect of
the left lung involving the inferior segment of the lingula and
anterior aspect of the left lower lobe laterally, this is favored to
reflect some mild post infectious or inflammatory scarring. No more
generalized findings are noted to suggest interstitial lung disease
on today's study. If there is persistent clinical concern for
interstitial lung disease, repeat high-resolution chest CT could be
considered in 12 months to assess for temporal changes in the
appearance of the lung parenchyma.

## 2019-11-15 DIAGNOSIS — Z0100 Encounter for examination of eyes and vision without abnormal findings: Secondary | ICD-10-CM | POA: Diagnosis not present

## 2019-12-15 MED FILL — OMEPRAZOLE DR 20 MG CAPSULE: 20 | 90 days supply | Qty: 90 | Fill #0

## 2019-12-15 MED FILL — VALACYCLOVIR HCL 500 MG TAB: 500 | 90 days supply | Qty: 90 | Fill #0

## 2019-12-15 MED FILL — DICLOFENAC SOD EC 75 MG TAB: 75 | 90 days supply | Qty: 180 | Fill #2

## 2019-12-28 ENCOUNTER — Other Ambulatory Visit (HOSPITAL_COMMUNITY): Payer: Self-pay | Admitting: General Practice

## 2019-12-28 DIAGNOSIS — B009 Herpesviral infection, unspecified: Secondary | ICD-10-CM | POA: Diagnosis not present

## 2019-12-28 DIAGNOSIS — I10 Essential (primary) hypertension: Secondary | ICD-10-CM | POA: Diagnosis not present

## 2019-12-28 DIAGNOSIS — G4700 Insomnia, unspecified: Secondary | ICD-10-CM | POA: Diagnosis not present

## 2019-12-28 DIAGNOSIS — D509 Iron deficiency anemia, unspecified: Secondary | ICD-10-CM | POA: Diagnosis not present

## 2019-12-28 DIAGNOSIS — K219 Gastro-esophageal reflux disease without esophagitis: Secondary | ICD-10-CM | POA: Diagnosis not present

## 2019-12-28 DIAGNOSIS — F329 Major depressive disorder, single episode, unspecified: Secondary | ICD-10-CM | POA: Diagnosis not present

## 2019-12-28 DIAGNOSIS — Z0000 Encounter for general adult medical examination without abnormal findings: Secondary | ICD-10-CM | POA: Diagnosis not present

## 2019-12-28 DIAGNOSIS — E785 Hyperlipidemia, unspecified: Secondary | ICD-10-CM | POA: Diagnosis not present

## 2020-01-26 MED FILL — SERTRALINE HCL 100 MG TAB: 100 | 90 days supply | Qty: 90 | Fill #0

## 2020-03-09 DIAGNOSIS — L03115 Cellulitis of right lower limb: Secondary | ICD-10-CM | POA: Diagnosis not present

## 2020-03-10 MED FILL — CLINDAMYCIN HCL 300 MG CAPS: 300 | 10 days supply | Qty: 30 | Fill #0

## 2020-03-13 DIAGNOSIS — L0390 Cellulitis, unspecified: Secondary | ICD-10-CM | POA: Diagnosis not present

## 2020-03-24 MED FILL — OMEPRAZOLE DR 20 MG CAPSULE: 20 | 90 days supply | Qty: 90 | Fill #1

## 2020-03-24 MED FILL — VALACYCLOVIR HCL 500 MG TAB: 500 | 90 days supply | Qty: 90 | Fill #0

## 2020-04-11 MED FILL — LISINOPRIL 10 MG TABS: 10 | 90 days supply | Qty: 90 | Fill #1

## 2020-04-18 DIAGNOSIS — H905 Unspecified sensorineural hearing loss: Secondary | ICD-10-CM | POA: Diagnosis not present

## 2020-04-27 MED FILL — SETLAKIN 0.15 MG-0.03 MG TA: 0.15-0.03 | 91 days supply | Qty: 91 | Fill #1

## 2020-04-27 MED FILL — SERTRALINE HCL 100 MG TAB: 100 | 90 days supply | Qty: 90 | Fill #1

## 2020-06-15 MED FILL — OMEPRAZOLE DR 20 MG CAPSULE: 20 | 90 days supply | Qty: 90 | Fill #2

## 2020-06-15 MED FILL — DICLOFENAC SOD EC 75 MG TAB: 75 | 90 days supply | Qty: 180 | Fill #0

## 2020-06-28 MED FILL — VALACYCLOVIR HCL 500 MG TAB: 500 | 90 days supply | Qty: 90 | Fill #1

## 2020-07-06 DIAGNOSIS — G4700 Insomnia, unspecified: Secondary | ICD-10-CM | POA: Diagnosis not present

## 2020-07-06 DIAGNOSIS — R6889 Other general symptoms and signs: Secondary | ICD-10-CM | POA: Diagnosis not present

## 2020-07-06 DIAGNOSIS — K219 Gastro-esophageal reflux disease without esophagitis: Secondary | ICD-10-CM | POA: Diagnosis not present

## 2020-07-06 DIAGNOSIS — Z6841 Body Mass Index (BMI) 40.0 and over, adult: Secondary | ICD-10-CM | POA: Diagnosis not present

## 2020-07-06 DIAGNOSIS — I10 Essential (primary) hypertension: Secondary | ICD-10-CM | POA: Diagnosis not present

## 2020-07-06 DIAGNOSIS — D509 Iron deficiency anemia, unspecified: Secondary | ICD-10-CM | POA: Diagnosis not present

## 2020-07-06 DIAGNOSIS — H40013 Open angle with borderline findings, low risk, bilateral: Secondary | ICD-10-CM | POA: Diagnosis not present

## 2020-07-06 DIAGNOSIS — R350 Frequency of micturition: Secondary | ICD-10-CM | POA: Diagnosis not present

## 2020-07-06 DIAGNOSIS — E785 Hyperlipidemia, unspecified: Secondary | ICD-10-CM | POA: Diagnosis not present

## 2020-07-06 DIAGNOSIS — B009 Herpesviral infection, unspecified: Secondary | ICD-10-CM | POA: Diagnosis not present

## 2020-07-06 DIAGNOSIS — F329 Major depressive disorder, single episode, unspecified: Secondary | ICD-10-CM | POA: Diagnosis not present

## 2020-07-07 ENCOUNTER — Other Ambulatory Visit (HOSPITAL_COMMUNITY): Payer: Self-pay | Admitting: General Practice

## 2020-07-07 MED FILL — LISINOPRIL 10 MG TABS: 10 | 90 days supply | Qty: 90 | Fill #0

## 2020-07-14 ENCOUNTER — Other Ambulatory Visit (HOSPITAL_COMMUNITY): Payer: Self-pay | Admitting: General Practice

## 2020-07-14 MED FILL — SETLAKIN 0.15 MG-0.03 MG TA: 0.15-0.03 | 91 days supply | Qty: 91 | Fill #0

## 2020-07-27 MED FILL — SERTRALINE HCL 100 MG TAB: 100 | 90 days supply | Qty: 90 | Fill #0

## 2020-09-21 MED FILL — OMEPRAZOLE DR 20 MG CAPSULE: 20 | 90 days supply | Qty: 90 | Fill #0

## 2020-10-18 MED FILL — SETLAKIN 0.15 MG-0.03 MG TA: 0.15-0.03 | 91 days supply | Qty: 91 | Fill #1

## 2020-10-18 MED FILL — VALACYCLOVIR HCL 500 MG TAB: 500 | 90 days supply | Qty: 90 | Fill #2

## 2020-10-18 MED FILL — LISINOPRIL 10 MG TABS: 10 | 90 days supply | Qty: 90 | Fill #1

## 2020-10-18 MED FILL — SERTRALINE HCL 100 MG TAB: 100 | 90 days supply | Qty: 90 | Fill #1

## 2020-11-23 DIAGNOSIS — H52223 Regular astigmatism, bilateral: Secondary | ICD-10-CM | POA: Diagnosis not present

## 2020-11-23 DIAGNOSIS — H50811 Duane's syndrome, right eye: Secondary | ICD-10-CM | POA: Diagnosis not present

## 2020-11-23 DIAGNOSIS — H40013 Open angle with borderline findings, low risk, bilateral: Secondary | ICD-10-CM | POA: Diagnosis not present

## 2020-11-23 DIAGNOSIS — H5203 Hypermetropia, bilateral: Secondary | ICD-10-CM | POA: Diagnosis not present

## 2020-12-21 DIAGNOSIS — R1900 Intra-abdominal and pelvic swelling, mass and lump, unspecified site: Secondary | ICD-10-CM | POA: Diagnosis not present

## 2020-12-21 DIAGNOSIS — K439 Ventral hernia without obstruction or gangrene: Secondary | ICD-10-CM | POA: Diagnosis not present

## 2020-12-26 DIAGNOSIS — R1900 Intra-abdominal and pelvic swelling, mass and lump, unspecified site: Secondary | ICD-10-CM | POA: Diagnosis not present

## 2020-12-28 ENCOUNTER — Other Ambulatory Visit (HOSPITAL_COMMUNITY): Payer: Self-pay

## 2020-12-28 MED ORDER — DICLOFENAC SODIUM 75 MG PO TBEC
75.0000 mg | DELAYED_RELEASE_TABLET | Freq: Two times a day (BID) | ORAL | 2 refills | Status: DC
  Filled 2020-12-28: qty 180, 90d supply, fill #0
  Filled 2021-07-02: qty 180, 90d supply, fill #1

## 2020-12-28 MED FILL — Omeprazole Cap Delayed Release 20 MG: ORAL | 90 days supply | Qty: 90 | Fill #0 | Status: AC

## 2020-12-29 ENCOUNTER — Other Ambulatory Visit (HOSPITAL_COMMUNITY): Payer: Self-pay

## 2021-01-04 ENCOUNTER — Other Ambulatory Visit (HOSPITAL_COMMUNITY): Payer: Self-pay

## 2021-01-04 DIAGNOSIS — K219 Gastro-esophageal reflux disease without esophagitis: Secondary | ICD-10-CM | POA: Diagnosis not present

## 2021-01-04 DIAGNOSIS — M2550 Pain in unspecified joint: Secondary | ICD-10-CM | POA: Diagnosis not present

## 2021-01-04 DIAGNOSIS — Z0000 Encounter for general adult medical examination without abnormal findings: Secondary | ICD-10-CM | POA: Diagnosis not present

## 2021-01-04 DIAGNOSIS — B009 Herpesviral infection, unspecified: Secondary | ICD-10-CM | POA: Diagnosis not present

## 2021-01-04 DIAGNOSIS — F32A Depression, unspecified: Secondary | ICD-10-CM | POA: Diagnosis not present

## 2021-01-04 DIAGNOSIS — J309 Allergic rhinitis, unspecified: Secondary | ICD-10-CM | POA: Diagnosis not present

## 2021-01-04 DIAGNOSIS — D509 Iron deficiency anemia, unspecified: Secondary | ICD-10-CM | POA: Diagnosis not present

## 2021-01-04 DIAGNOSIS — R000 Tachycardia, unspecified: Secondary | ICD-10-CM | POA: Diagnosis not present

## 2021-01-04 DIAGNOSIS — I10 Essential (primary) hypertension: Secondary | ICD-10-CM | POA: Diagnosis not present

## 2021-01-04 MED ORDER — MONTELUKAST SODIUM 10 MG PO TABS
10.0000 mg | ORAL_TABLET | Freq: Every day | ORAL | 1 refills | Status: DC
  Filled 2021-01-04: qty 90, 90d supply, fill #0

## 2021-01-04 MED ORDER — OMEPRAZOLE 20 MG PO CPDR
1.0000 | DELAYED_RELEASE_CAPSULE | Freq: Every day | ORAL | 1 refills | Status: DC
  Filled 2021-01-04 – 2021-04-05 (×2): qty 90, 90d supply, fill #0
  Filled 2021-07-02: qty 90, 90d supply, fill #1

## 2021-01-04 MED ORDER — LISINOPRIL 10 MG PO TABS
10.0000 mg | ORAL_TABLET | Freq: Every day | ORAL | 1 refills | Status: DC
  Filled 2021-01-04: qty 90, 90d supply, fill #0
  Filled 2021-04-20: qty 90, 90d supply, fill #1

## 2021-01-04 MED ORDER — SERTRALINE HCL 100 MG PO TABS
100.0000 mg | ORAL_TABLET | Freq: Every day | ORAL | 0 refills | Status: DC
  Filled 2021-01-04: qty 90, 90d supply, fill #0

## 2021-01-12 ENCOUNTER — Other Ambulatory Visit (HOSPITAL_COMMUNITY): Payer: Self-pay

## 2021-01-15 ENCOUNTER — Other Ambulatory Visit (HOSPITAL_COMMUNITY): Payer: Self-pay

## 2021-01-15 MED ORDER — VALACYCLOVIR HCL 500 MG PO TABS
500.0000 mg | ORAL_TABLET | Freq: Every day | ORAL | 2 refills | Status: DC
  Filled 2021-01-15: qty 90, 90d supply, fill #0
  Filled 2021-04-20: qty 90, 90d supply, fill #1
  Filled 2021-07-25: qty 90, 90d supply, fill #2

## 2021-01-17 ENCOUNTER — Other Ambulatory Visit (HOSPITAL_COMMUNITY): Payer: Self-pay

## 2021-02-12 DIAGNOSIS — Z0000 Encounter for general adult medical examination without abnormal findings: Secondary | ICD-10-CM | POA: Diagnosis not present

## 2021-02-12 DIAGNOSIS — Z01419 Encounter for gynecological examination (general) (routine) without abnormal findings: Secondary | ICD-10-CM | POA: Diagnosis not present

## 2021-02-12 DIAGNOSIS — Z1151 Encounter for screening for human papillomavirus (HPV): Secondary | ICD-10-CM | POA: Diagnosis not present

## 2021-02-12 DIAGNOSIS — Z3041 Encounter for surveillance of contraceptive pills: Secondary | ICD-10-CM | POA: Diagnosis not present

## 2021-02-12 DIAGNOSIS — F17210 Nicotine dependence, cigarettes, uncomplicated: Secondary | ICD-10-CM | POA: Diagnosis not present

## 2021-02-12 DIAGNOSIS — Z1231 Encounter for screening mammogram for malignant neoplasm of breast: Secondary | ICD-10-CM | POA: Diagnosis not present

## 2021-02-12 DIAGNOSIS — N393 Stress incontinence (female) (male): Secondary | ICD-10-CM | POA: Diagnosis not present

## 2021-02-12 DIAGNOSIS — N8111 Cystocele, midline: Secondary | ICD-10-CM | POA: Diagnosis not present

## 2021-02-12 DIAGNOSIS — Z1212 Encounter for screening for malignant neoplasm of rectum: Secondary | ICD-10-CM | POA: Diagnosis not present

## 2021-03-26 ENCOUNTER — Other Ambulatory Visit (HOSPITAL_COMMUNITY): Payer: Self-pay

## 2021-03-26 MED ORDER — SLYND 4 MG PO TABS
1.0000 | ORAL_TABLET | Freq: Every day | ORAL | 4 refills | Status: AC
  Filled 2021-03-26 – 2021-03-27 (×3): qty 84, 84d supply, fill #0

## 2021-03-27 ENCOUNTER — Other Ambulatory Visit (HOSPITAL_COMMUNITY): Payer: Self-pay

## 2021-03-27 MED FILL — Omeprazole Cap Delayed Release 20 MG: ORAL | 90 days supply | Qty: 90 | Fill #1 | Status: CN

## 2021-04-04 ENCOUNTER — Other Ambulatory Visit (HOSPITAL_COMMUNITY): Payer: Self-pay

## 2021-04-05 ENCOUNTER — Other Ambulatory Visit (HOSPITAL_COMMUNITY): Payer: Self-pay

## 2021-04-06 ENCOUNTER — Other Ambulatory Visit (HOSPITAL_COMMUNITY): Payer: Self-pay

## 2021-04-19 ENCOUNTER — Other Ambulatory Visit (HOSPITAL_COMMUNITY): Payer: Self-pay

## 2021-04-20 ENCOUNTER — Other Ambulatory Visit (HOSPITAL_COMMUNITY): Payer: Self-pay

## 2021-04-20 MED ORDER — SERTRALINE HCL 100 MG PO TABS
100.0000 mg | ORAL_TABLET | Freq: Every day | ORAL | 1 refills | Status: DC
  Filled 2021-04-20: qty 90, 90d supply, fill #0
  Filled 2021-07-25: qty 90, 90d supply, fill #1

## 2021-06-19 DIAGNOSIS — U071 COVID-19: Secondary | ICD-10-CM | POA: Diagnosis not present

## 2021-07-03 ENCOUNTER — Other Ambulatory Visit (HOSPITAL_COMMUNITY): Payer: Self-pay

## 2021-07-25 ENCOUNTER — Other Ambulatory Visit (HOSPITAL_COMMUNITY): Payer: Self-pay

## 2021-07-25 DIAGNOSIS — Z0000 Encounter for general adult medical examination without abnormal findings: Secondary | ICD-10-CM | POA: Diagnosis not present

## 2021-07-25 DIAGNOSIS — J309 Allergic rhinitis, unspecified: Secondary | ICD-10-CM | POA: Diagnosis not present

## 2021-07-25 DIAGNOSIS — I10 Essential (primary) hypertension: Secondary | ICD-10-CM | POA: Diagnosis not present

## 2021-07-25 DIAGNOSIS — G4700 Insomnia, unspecified: Secondary | ICD-10-CM | POA: Diagnosis not present

## 2021-07-25 DIAGNOSIS — D509 Iron deficiency anemia, unspecified: Secondary | ICD-10-CM | POA: Diagnosis not present

## 2021-07-25 DIAGNOSIS — Z1159 Encounter for screening for other viral diseases: Secondary | ICD-10-CM | POA: Diagnosis not present

## 2021-07-25 DIAGNOSIS — B009 Herpesviral infection, unspecified: Secondary | ICD-10-CM | POA: Diagnosis not present

## 2021-07-25 DIAGNOSIS — E785 Hyperlipidemia, unspecified: Secondary | ICD-10-CM | POA: Diagnosis not present

## 2021-07-25 DIAGNOSIS — E559 Vitamin D deficiency, unspecified: Secondary | ICD-10-CM | POA: Diagnosis not present

## 2021-07-25 DIAGNOSIS — F32A Depression, unspecified: Secondary | ICD-10-CM | POA: Diagnosis not present

## 2021-07-25 DIAGNOSIS — K219 Gastro-esophageal reflux disease without esophagitis: Secondary | ICD-10-CM | POA: Diagnosis not present

## 2021-07-25 MED ORDER — LISINOPRIL 10 MG PO TABS
10.0000 mg | ORAL_TABLET | Freq: Every day | ORAL | 1 refills | Status: DC
  Filled 2021-07-25: qty 90, 90d supply, fill #0
  Filled 2021-10-23: qty 90, 90d supply, fill #1

## 2021-07-31 ENCOUNTER — Other Ambulatory Visit (HOSPITAL_COMMUNITY): Payer: Self-pay

## 2021-07-31 MED ORDER — ATORVASTATIN CALCIUM 10 MG PO TABS
10.0000 mg | ORAL_TABLET | Freq: Every day | ORAL | 0 refills | Status: DC
  Filled 2021-07-31: qty 90, 90d supply, fill #0

## 2021-09-13 DIAGNOSIS — E785 Hyperlipidemia, unspecified: Secondary | ICD-10-CM | POA: Diagnosis not present

## 2021-10-03 ENCOUNTER — Other Ambulatory Visit (HOSPITAL_COMMUNITY): Payer: Self-pay

## 2021-10-03 MED ORDER — OMEPRAZOLE 20 MG PO CPDR
20.0000 mg | DELAYED_RELEASE_CAPSULE | Freq: Every day | ORAL | 1 refills | Status: DC
  Filled 2021-10-03: qty 90, 90d supply, fill #0
  Filled 2021-12-26: qty 90, 90d supply, fill #1

## 2021-10-23 ENCOUNTER — Other Ambulatory Visit (HOSPITAL_COMMUNITY): Payer: Self-pay

## 2021-10-23 MED ORDER — SERTRALINE HCL 100 MG PO TABS
100.0000 mg | ORAL_TABLET | Freq: Every day | ORAL | 1 refills | Status: DC
  Filled 2021-10-23: qty 90, 90d supply, fill #0
  Filled 2022-01-23: qty 90, 90d supply, fill #1

## 2021-10-23 MED ORDER — VALACYCLOVIR HCL 500 MG PO TABS
500.0000 mg | ORAL_TABLET | Freq: Every day | ORAL | 2 refills | Status: DC
  Filled 2021-10-23: qty 90, 90d supply, fill #0
  Filled 2022-01-23: qty 90, 90d supply, fill #1
  Filled 2022-04-25: qty 90, 90d supply, fill #2

## 2021-10-31 ENCOUNTER — Other Ambulatory Visit (HOSPITAL_COMMUNITY): Payer: Self-pay

## 2021-11-01 ENCOUNTER — Other Ambulatory Visit (HOSPITAL_COMMUNITY): Payer: Self-pay

## 2021-11-01 MED ORDER — ATORVASTATIN CALCIUM 10 MG PO TABS
10.0000 mg | ORAL_TABLET | Freq: Every day | ORAL | 3 refills | Status: DC
  Filled 2021-11-01: qty 90, 90d supply, fill #0
  Filled 2022-01-23: qty 90, 90d supply, fill #1
  Filled 2022-04-25: qty 90, 90d supply, fill #2
  Filled 2022-07-25: qty 90, 90d supply, fill #3

## 2021-11-08 DIAGNOSIS — E785 Hyperlipidemia, unspecified: Secondary | ICD-10-CM | POA: Diagnosis not present

## 2021-11-08 DIAGNOSIS — K219 Gastro-esophageal reflux disease without esophagitis: Secondary | ICD-10-CM | POA: Diagnosis not present

## 2021-11-08 DIAGNOSIS — I10 Essential (primary) hypertension: Secondary | ICD-10-CM | POA: Diagnosis not present

## 2021-11-08 DIAGNOSIS — Z86010 Personal history of colonic polyps: Secondary | ICD-10-CM | POA: Diagnosis not present

## 2021-11-08 DIAGNOSIS — Z8371 Family history of colonic polyps: Secondary | ICD-10-CM | POA: Diagnosis not present

## 2021-11-15 ENCOUNTER — Other Ambulatory Visit (HOSPITAL_COMMUNITY): Payer: Self-pay

## 2021-11-15 MED ORDER — SUTAB 1479-225-188 MG PO TABS
ORAL_TABLET | ORAL | 0 refills | Status: AC
  Filled 2021-11-15: qty 24, 1d supply, fill #0

## 2021-11-26 DIAGNOSIS — D509 Iron deficiency anemia, unspecified: Secondary | ICD-10-CM | POA: Diagnosis not present

## 2021-11-26 DIAGNOSIS — K219 Gastro-esophageal reflux disease without esophagitis: Secondary | ICD-10-CM | POA: Diagnosis not present

## 2021-11-26 DIAGNOSIS — Z86010 Personal history of colonic polyps: Secondary | ICD-10-CM | POA: Diagnosis not present

## 2021-11-26 DIAGNOSIS — Z1211 Encounter for screening for malignant neoplasm of colon: Secondary | ICD-10-CM | POA: Diagnosis not present

## 2021-11-26 DIAGNOSIS — Z8371 Family history of colonic polyps: Secondary | ICD-10-CM | POA: Diagnosis not present

## 2021-12-26 ENCOUNTER — Other Ambulatory Visit (HOSPITAL_COMMUNITY): Payer: Self-pay

## 2021-12-26 MED ORDER — DICLOFENAC SODIUM 75 MG PO TBEC
75.0000 mg | DELAYED_RELEASE_TABLET | Freq: Two times a day (BID) | ORAL | 0 refills | Status: DC
  Filled 2021-12-26: qty 180, 90d supply, fill #0

## 2022-01-03 DIAGNOSIS — H40013 Open angle with borderline findings, low risk, bilateral: Secondary | ICD-10-CM | POA: Diagnosis not present

## 2022-01-03 DIAGNOSIS — H50811 Duane's syndrome, right eye: Secondary | ICD-10-CM | POA: Diagnosis not present

## 2022-01-03 DIAGNOSIS — H52223 Regular astigmatism, bilateral: Secondary | ICD-10-CM | POA: Diagnosis not present

## 2022-01-03 DIAGNOSIS — H5203 Hypermetropia, bilateral: Secondary | ICD-10-CM | POA: Diagnosis not present

## 2022-01-23 ENCOUNTER — Other Ambulatory Visit (HOSPITAL_COMMUNITY): Payer: Self-pay

## 2022-01-23 DIAGNOSIS — J309 Allergic rhinitis, unspecified: Secondary | ICD-10-CM | POA: Diagnosis not present

## 2022-01-23 DIAGNOSIS — I10 Essential (primary) hypertension: Secondary | ICD-10-CM | POA: Diagnosis not present

## 2022-01-23 DIAGNOSIS — E559 Vitamin D deficiency, unspecified: Secondary | ICD-10-CM | POA: Diagnosis not present

## 2022-01-23 DIAGNOSIS — E785 Hyperlipidemia, unspecified: Secondary | ICD-10-CM | POA: Diagnosis not present

## 2022-01-23 DIAGNOSIS — Z0000 Encounter for general adult medical examination without abnormal findings: Secondary | ICD-10-CM | POA: Diagnosis not present

## 2022-01-23 DIAGNOSIS — Z6841 Body Mass Index (BMI) 40.0 and over, adult: Secondary | ICD-10-CM | POA: Diagnosis not present

## 2022-01-23 DIAGNOSIS — D509 Iron deficiency anemia, unspecified: Secondary | ICD-10-CM | POA: Diagnosis not present

## 2022-01-23 DIAGNOSIS — G4700 Insomnia, unspecified: Secondary | ICD-10-CM | POA: Diagnosis not present

## 2022-01-23 DIAGNOSIS — R000 Tachycardia, unspecified: Secondary | ICD-10-CM | POA: Diagnosis not present

## 2022-01-24 ENCOUNTER — Other Ambulatory Visit (HOSPITAL_COMMUNITY): Payer: Self-pay

## 2022-01-24 MED ORDER — LISINOPRIL 10 MG PO TABS
10.0000 mg | ORAL_TABLET | Freq: Every day | ORAL | 1 refills | Status: DC
  Filled 2022-01-24: qty 90, 90d supply, fill #0
  Filled 2022-04-25: qty 90, 90d supply, fill #1

## 2022-02-14 DIAGNOSIS — F17210 Nicotine dependence, cigarettes, uncomplicated: Secondary | ICD-10-CM | POA: Diagnosis not present

## 2022-02-14 DIAGNOSIS — Z01419 Encounter for gynecological examination (general) (routine) without abnormal findings: Secondary | ICD-10-CM | POA: Diagnosis not present

## 2022-02-14 DIAGNOSIS — Z1231 Encounter for screening mammogram for malignant neoplasm of breast: Secondary | ICD-10-CM | POA: Diagnosis not present

## 2022-02-14 DIAGNOSIS — I10 Essential (primary) hypertension: Secondary | ICD-10-CM | POA: Diagnosis not present

## 2022-04-03 ENCOUNTER — Other Ambulatory Visit (HOSPITAL_COMMUNITY): Payer: Self-pay

## 2022-04-03 MED ORDER — MONTELUKAST SODIUM 10 MG PO TABS
10.0000 mg | ORAL_TABLET | Freq: Every day | ORAL | 1 refills | Status: DC
  Filled 2022-04-03: qty 90, 90d supply, fill #0
  Filled 2022-10-01: qty 90, 90d supply, fill #1

## 2022-04-03 MED ORDER — OMEPRAZOLE 20 MG PO CPDR
20.0000 mg | DELAYED_RELEASE_CAPSULE | Freq: Every day | ORAL | 1 refills | Status: DC
  Filled 2022-04-03: qty 90, 90d supply, fill #0
  Filled 2022-07-02: qty 90, 90d supply, fill #1

## 2022-04-25 ENCOUNTER — Other Ambulatory Visit (HOSPITAL_COMMUNITY): Payer: Self-pay

## 2022-04-25 MED ORDER — SERTRALINE HCL 100 MG PO TABS
100.0000 mg | ORAL_TABLET | Freq: Every day | ORAL | 1 refills | Status: DC
  Filled 2022-04-25: qty 90, 90d supply, fill #0
  Filled 2022-04-25: qty 61, 61d supply, fill #0
  Filled 2022-07-25: qty 90, 90d supply, fill #1

## 2022-04-25 MED ORDER — DICLOFENAC SODIUM 75 MG PO TBEC
75.0000 mg | DELAYED_RELEASE_TABLET | Freq: Two times a day (BID) | ORAL | 1 refills | Status: DC
  Filled 2022-04-25: qty 166, 83d supply, fill #0
  Filled 2022-04-25: qty 14, 7d supply, fill #0
  Filled 2022-10-01: qty 180, 90d supply, fill #1

## 2022-05-28 ENCOUNTER — Other Ambulatory Visit (HOSPITAL_COMMUNITY): Payer: Self-pay

## 2022-07-02 ENCOUNTER — Other Ambulatory Visit (HOSPITAL_COMMUNITY): Payer: Self-pay

## 2022-07-25 ENCOUNTER — Other Ambulatory Visit (HOSPITAL_COMMUNITY): Payer: Self-pay

## 2022-07-25 MED ORDER — LISINOPRIL 10 MG PO TABS
10.0000 mg | ORAL_TABLET | Freq: Every day | ORAL | 0 refills | Status: DC
  Filled 2022-07-25: qty 90, 90d supply, fill #0

## 2022-07-25 MED ORDER — VALACYCLOVIR HCL 500 MG PO TABS
500.0000 mg | ORAL_TABLET | Freq: Every day | ORAL | 0 refills | Status: DC
  Filled 2022-07-25: qty 90, 90d supply, fill #0

## 2022-08-15 DIAGNOSIS — D509 Iron deficiency anemia, unspecified: Secondary | ICD-10-CM | POA: Diagnosis not present

## 2022-08-15 DIAGNOSIS — E785 Hyperlipidemia, unspecified: Secondary | ICD-10-CM | POA: Diagnosis not present

## 2022-08-15 DIAGNOSIS — G4700 Insomnia, unspecified: Secondary | ICD-10-CM | POA: Diagnosis not present

## 2022-08-15 DIAGNOSIS — Z5181 Encounter for therapeutic drug level monitoring: Secondary | ICD-10-CM | POA: Diagnosis not present

## 2022-08-15 DIAGNOSIS — J302 Other seasonal allergic rhinitis: Secondary | ICD-10-CM | POA: Diagnosis not present

## 2022-08-15 DIAGNOSIS — E559 Vitamin D deficiency, unspecified: Secondary | ICD-10-CM | POA: Diagnosis not present

## 2022-08-15 DIAGNOSIS — I10 Essential (primary) hypertension: Secondary | ICD-10-CM | POA: Diagnosis not present

## 2022-08-15 DIAGNOSIS — F17200 Nicotine dependence, unspecified, uncomplicated: Secondary | ICD-10-CM | POA: Diagnosis not present

## 2022-08-15 DIAGNOSIS — Z79891 Long term (current) use of opiate analgesic: Secondary | ICD-10-CM | POA: Diagnosis not present

## 2022-08-15 DIAGNOSIS — F32A Depression, unspecified: Secondary | ICD-10-CM | POA: Diagnosis not present

## 2022-08-15 DIAGNOSIS — F419 Anxiety disorder, unspecified: Secondary | ICD-10-CM | POA: Diagnosis not present

## 2022-08-15 DIAGNOSIS — K219 Gastro-esophageal reflux disease without esophagitis: Secondary | ICD-10-CM | POA: Diagnosis not present

## 2022-10-01 ENCOUNTER — Other Ambulatory Visit (HOSPITAL_COMMUNITY): Payer: Self-pay

## 2022-10-01 ENCOUNTER — Other Ambulatory Visit: Payer: Self-pay

## 2022-10-01 MED ORDER — ATORVASTATIN CALCIUM 10 MG PO TABS
10.0000 mg | ORAL_TABLET | Freq: Every day | ORAL | 1 refills | Status: DC
  Filled 2022-10-01 – 2022-10-22 (×2): qty 90, 90d supply, fill #0
  Filled 2023-01-21: qty 90, 90d supply, fill #1

## 2022-10-01 MED ORDER — VALACYCLOVIR HCL 500 MG PO TABS
500.0000 mg | ORAL_TABLET | Freq: Every day | ORAL | 1 refills | Status: DC
  Filled 2022-10-01 – 2022-10-22 (×2): qty 90, 90d supply, fill #0
  Filled 2023-01-21: qty 90, 90d supply, fill #1

## 2022-10-01 MED ORDER — SERTRALINE HCL 100 MG PO TABS
100.0000 mg | ORAL_TABLET | Freq: Every day | ORAL | 1 refills | Status: DC
  Filled 2022-10-01 – 2022-10-22 (×2): qty 90, 90d supply, fill #0
  Filled 2023-01-21: qty 90, 90d supply, fill #1

## 2022-10-01 MED ORDER — LISINOPRIL 10 MG PO TABS
10.0000 mg | ORAL_TABLET | Freq: Every day | ORAL | 1 refills | Status: DC
  Filled 2022-10-01 – 2022-10-22 (×2): qty 90, 90d supply, fill #0
  Filled 2023-01-21: qty 90, 90d supply, fill #1

## 2022-10-01 MED ORDER — OMEPRAZOLE 20 MG PO CPDR
20.0000 mg | DELAYED_RELEASE_CAPSULE | Freq: Every day | ORAL | 1 refills | Status: DC
  Filled 2022-10-01: qty 90, 90d supply, fill #0
  Filled 2022-12-31: qty 90, 90d supply, fill #1

## 2022-10-22 ENCOUNTER — Other Ambulatory Visit (HOSPITAL_COMMUNITY): Payer: Self-pay

## 2022-10-22 ENCOUNTER — Other Ambulatory Visit: Payer: Self-pay

## 2022-12-31 ENCOUNTER — Other Ambulatory Visit (HOSPITAL_COMMUNITY): Payer: Self-pay

## 2023-01-21 ENCOUNTER — Other Ambulatory Visit (HOSPITAL_COMMUNITY): Payer: Self-pay

## 2023-01-21 MED ORDER — DICLOFENAC SODIUM 75 MG PO TBEC
75.0000 mg | DELAYED_RELEASE_TABLET | Freq: Two times a day (BID) | ORAL | 1 refills | Status: DC
  Filled 2023-01-21: qty 180, 90d supply, fill #0
  Filled 2023-05-23: qty 180, 90d supply, fill #1

## 2023-01-22 ENCOUNTER — Other Ambulatory Visit (HOSPITAL_COMMUNITY): Payer: Self-pay

## 2023-02-18 ENCOUNTER — Other Ambulatory Visit (HOSPITAL_COMMUNITY): Payer: Self-pay

## 2023-02-18 DIAGNOSIS — E559 Vitamin D deficiency, unspecified: Secondary | ICD-10-CM | POA: Diagnosis not present

## 2023-02-18 DIAGNOSIS — D509 Iron deficiency anemia, unspecified: Secondary | ICD-10-CM | POA: Diagnosis not present

## 2023-02-18 DIAGNOSIS — E785 Hyperlipidemia, unspecified: Secondary | ICD-10-CM | POA: Diagnosis not present

## 2023-02-18 MED ORDER — PRAMIPEXOLE DIHYDROCHLORIDE 0.5 MG PO TABS
0.5000 mg | ORAL_TABLET | Freq: Every evening | ORAL | 0 refills | Status: DC
  Filled 2023-02-18: qty 30, 30d supply, fill #0

## 2023-02-24 ENCOUNTER — Other Ambulatory Visit (HOSPITAL_COMMUNITY): Payer: Self-pay

## 2023-03-12 ENCOUNTER — Other Ambulatory Visit (HOSPITAL_COMMUNITY): Payer: Self-pay

## 2023-03-12 MED ORDER — SLYND 4 MG PO TABS
4.0000 mg | ORAL_TABLET | Freq: Every day | ORAL | 4 refills | Status: DC
  Filled 2023-03-12 – 2023-04-07 (×2): qty 84, 84d supply, fill #0
  Filled 2023-06-30: qty 84, 84d supply, fill #1

## 2023-03-25 ENCOUNTER — Other Ambulatory Visit (HOSPITAL_COMMUNITY): Payer: Self-pay

## 2023-04-07 ENCOUNTER — Other Ambulatory Visit (HOSPITAL_COMMUNITY): Payer: Self-pay

## 2023-04-07 MED ORDER — OMEPRAZOLE 20 MG PO CPDR
20.0000 mg | DELAYED_RELEASE_CAPSULE | Freq: Every day | ORAL | 1 refills | Status: AC
  Filled 2023-04-07: qty 90, 90d supply, fill #0

## 2023-04-23 ENCOUNTER — Other Ambulatory Visit (HOSPITAL_COMMUNITY): Payer: Self-pay

## 2023-04-23 MED ORDER — SERTRALINE HCL 100 MG PO TABS
100.0000 mg | ORAL_TABLET | Freq: Every day | ORAL | 1 refills | Status: DC
  Filled 2023-04-23: qty 90, 90d supply, fill #0
  Filled 2023-07-27: qty 90, 90d supply, fill #1

## 2023-04-23 MED ORDER — LISINOPRIL 10 MG PO TABS
10.0000 mg | ORAL_TABLET | Freq: Every day | ORAL | 1 refills | Status: DC
  Filled 2023-04-23: qty 90, 90d supply, fill #0
  Filled 2023-07-27: qty 90, 90d supply, fill #1

## 2023-04-23 MED ORDER — ATORVASTATIN CALCIUM 10 MG PO TABS
10.0000 mg | ORAL_TABLET | Freq: Every day | ORAL | 1 refills | Status: DC
  Filled 2023-04-23: qty 90, 90d supply, fill #0
  Filled 2023-07-27: qty 90, 90d supply, fill #1

## 2023-04-24 ENCOUNTER — Other Ambulatory Visit (HOSPITAL_COMMUNITY): Payer: Self-pay

## 2023-04-24 MED ORDER — VALACYCLOVIR HCL 500 MG PO TABS
500.0000 mg | ORAL_TABLET | Freq: Every day | ORAL | 1 refills | Status: DC
  Filled 2023-04-24: qty 90, 90d supply, fill #0
  Filled 2023-07-27: qty 90, 90d supply, fill #1

## 2023-05-23 ENCOUNTER — Other Ambulatory Visit (HOSPITAL_COMMUNITY): Payer: Self-pay

## 2023-06-19 ENCOUNTER — Other Ambulatory Visit (HOSPITAL_COMMUNITY): Payer: Self-pay

## 2023-06-19 MED ORDER — PRAMIPEXOLE DIHYDROCHLORIDE 0.5 MG PO TABS
0.5000 mg | ORAL_TABLET | Freq: Every day | ORAL | 0 refills | Status: DC
  Filled 2023-06-19: qty 30, 30d supply, fill #0

## 2023-06-30 ENCOUNTER — Other Ambulatory Visit (HOSPITAL_COMMUNITY): Payer: Self-pay

## 2023-07-02 DIAGNOSIS — H5203 Hypermetropia, bilateral: Secondary | ICD-10-CM | POA: Diagnosis not present

## 2023-07-14 DIAGNOSIS — Z1231 Encounter for screening mammogram for malignant neoplasm of breast: Secondary | ICD-10-CM | POA: Diagnosis not present

## 2023-07-27 ENCOUNTER — Other Ambulatory Visit (HOSPITAL_COMMUNITY): Payer: Self-pay

## 2023-07-28 ENCOUNTER — Other Ambulatory Visit (HOSPITAL_COMMUNITY): Payer: Self-pay

## 2023-07-29 ENCOUNTER — Other Ambulatory Visit (HOSPITAL_COMMUNITY): Payer: Self-pay

## 2023-07-29 MED ORDER — MONTELUKAST SODIUM 10 MG PO TABS
10.0000 mg | ORAL_TABLET | Freq: Every day | ORAL | 0 refills | Status: DC
  Filled 2023-07-29: qty 90, 90d supply, fill #0

## 2023-07-29 MED ORDER — PRAMIPEXOLE DIHYDROCHLORIDE 0.5 MG PO TABS
0.5000 mg | ORAL_TABLET | Freq: Every day | ORAL | 0 refills | Status: DC
  Filled 2023-07-29: qty 90, 90d supply, fill #0

## 2023-08-21 ENCOUNTER — Other Ambulatory Visit (HOSPITAL_COMMUNITY): Payer: Self-pay

## 2023-08-21 MED ORDER — DICLOFENAC SODIUM 75 MG PO TBEC
75.0000 mg | DELAYED_RELEASE_TABLET | Freq: Two times a day (BID) | ORAL | 1 refills | Status: AC
  Filled 2023-08-21: qty 180, 90d supply, fill #0

## 2023-08-28 ENCOUNTER — Other Ambulatory Visit (HOSPITAL_COMMUNITY): Payer: Self-pay

## 2023-08-28 DIAGNOSIS — Z79891 Long term (current) use of opiate analgesic: Secondary | ICD-10-CM | POA: Diagnosis not present

## 2023-08-28 DIAGNOSIS — F419 Anxiety disorder, unspecified: Secondary | ICD-10-CM | POA: Diagnosis not present

## 2023-08-28 DIAGNOSIS — Z5181 Encounter for therapeutic drug level monitoring: Secondary | ICD-10-CM | POA: Diagnosis not present

## 2023-08-28 DIAGNOSIS — E785 Hyperlipidemia, unspecified: Secondary | ICD-10-CM | POA: Diagnosis not present

## 2023-08-28 DIAGNOSIS — E559 Vitamin D deficiency, unspecified: Secondary | ICD-10-CM | POA: Diagnosis not present

## 2023-08-28 DIAGNOSIS — D509 Iron deficiency anemia, unspecified: Secondary | ICD-10-CM | POA: Diagnosis not present

## 2023-08-28 DIAGNOSIS — Z1329 Encounter for screening for other suspected endocrine disorder: Secondary | ICD-10-CM | POA: Diagnosis not present

## 2023-08-28 MED ORDER — OMEPRAZOLE 20 MG PO TBEC
20.0000 mg | DELAYED_RELEASE_TABLET | Freq: Every day | ORAL | 6 refills | Status: DC
  Filled 2023-08-28: qty 30, 30d supply, fill #0
  Filled 2023-08-28: qty 28, 28d supply, fill #0
  Filled 2023-09-25 – 2023-09-29 (×2): qty 84, 84d supply, fill #1
  Filled 2023-10-01: qty 28, 28d supply, fill #1

## 2023-09-16 DIAGNOSIS — M65312 Trigger thumb, left thumb: Secondary | ICD-10-CM | POA: Diagnosis not present

## 2023-09-25 ENCOUNTER — Other Ambulatory Visit (HOSPITAL_COMMUNITY): Payer: Self-pay

## 2023-09-26 ENCOUNTER — Other Ambulatory Visit (HOSPITAL_COMMUNITY): Payer: Self-pay

## 2023-09-29 ENCOUNTER — Encounter (HOSPITAL_COMMUNITY): Payer: Self-pay

## 2023-09-29 ENCOUNTER — Other Ambulatory Visit (HOSPITAL_BASED_OUTPATIENT_CLINIC_OR_DEPARTMENT_OTHER): Payer: Self-pay

## 2023-09-29 ENCOUNTER — Other Ambulatory Visit (HOSPITAL_COMMUNITY): Payer: Self-pay

## 2023-09-30 ENCOUNTER — Other Ambulatory Visit (HOSPITAL_COMMUNITY): Payer: Self-pay

## 2023-10-01 ENCOUNTER — Other Ambulatory Visit (HOSPITAL_COMMUNITY): Payer: Self-pay

## 2023-10-01 MED ORDER — PANTOPRAZOLE SODIUM 40 MG PO TBEC
40.0000 mg | DELAYED_RELEASE_TABLET | Freq: Every day | ORAL | 5 refills | Status: AC
  Filled 2023-10-01: qty 30, 30d supply, fill #0
  Filled 2023-10-31: qty 30, 30d supply, fill #1

## 2023-10-22 ENCOUNTER — Other Ambulatory Visit (HOSPITAL_COMMUNITY): Payer: Self-pay

## 2023-10-23 ENCOUNTER — Other Ambulatory Visit (HOSPITAL_COMMUNITY): Payer: Self-pay

## 2023-10-23 MED ORDER — PRAMIPEXOLE DIHYDROCHLORIDE 0.5 MG PO TABS
0.5000 mg | ORAL_TABLET | Freq: Every day | ORAL | 0 refills | Status: AC
  Filled 2023-10-23: qty 90, 90d supply, fill #0

## 2023-10-23 MED ORDER — MONTELUKAST SODIUM 10 MG PO TABS
10.0000 mg | ORAL_TABLET | Freq: Every day | ORAL | 0 refills | Status: AC
  Filled 2023-10-23: qty 90, 90d supply, fill #0

## 2023-10-23 MED ORDER — LISINOPRIL 10 MG PO TABS
10.0000 mg | ORAL_TABLET | Freq: Every day | ORAL | 1 refills | Status: AC
  Filled 2023-10-23: qty 90, 90d supply, fill #0

## 2023-10-23 MED ORDER — ATORVASTATIN CALCIUM 10 MG PO TABS
10.0000 mg | ORAL_TABLET | Freq: Every day | ORAL | 1 refills | Status: AC
  Filled 2023-10-23: qty 90, 90d supply, fill #0

## 2023-10-23 MED ORDER — VALACYCLOVIR HCL 500 MG PO TABS
500.0000 mg | ORAL_TABLET | Freq: Every day | ORAL | 1 refills | Status: AC
  Filled 2023-10-23: qty 90, 90d supply, fill #0

## 2023-10-23 MED ORDER — SERTRALINE HCL 100 MG PO TABS
100.0000 mg | ORAL_TABLET | Freq: Every day | ORAL | 1 refills | Status: AC
  Filled 2023-10-23: qty 90, 90d supply, fill #0

## 2023-11-28 ENCOUNTER — Other Ambulatory Visit (HOSPITAL_COMMUNITY): Payer: Self-pay
